# Patient Record
Sex: Male | Born: 1950 | Race: White | Hispanic: No | Marital: Single | Smoking: Current every day smoker
Health system: Southern US, Community
[De-identification: ages and names within clinical notes are randomized; demographics above are authoritative.]

## PROBLEM LIST (undated history)

## (undated) DIAGNOSIS — T7840XA Allergy, unspecified, initial encounter: Secondary | ICD-10-CM

## (undated) DIAGNOSIS — M109 Gout, unspecified: Secondary | ICD-10-CM

## (undated) HISTORY — PX: BUNIONECTOMY: SHX129

## (undated) HISTORY — DX: Allergy, unspecified, initial encounter: T78.40XA

## (undated) HISTORY — DX: Gout, unspecified: M10.9

## (undated) HISTORY — PX: CHOLECYSTECTOMY: SHX55

## (undated) HISTORY — PX: OTHER SURGICAL HISTORY: SHX169

---

## 2002-01-24 ENCOUNTER — Emergency Department (HOSPITAL_COMMUNITY): Admission: EM | Admit: 2002-01-24 | Discharge: 2002-01-24 | Payer: Self-pay | Admitting: Emergency Medicine

## 2002-08-02 ENCOUNTER — Encounter: Admission: RE | Admit: 2002-08-02 | Discharge: 2002-08-02 | Payer: Self-pay | Admitting: Internal Medicine

## 2002-08-02 ENCOUNTER — Encounter: Payer: Self-pay | Admitting: Internal Medicine

## 2004-07-08 ENCOUNTER — Ambulatory Visit: Payer: Self-pay | Admitting: Family Medicine

## 2004-07-14 ENCOUNTER — Encounter: Admission: RE | Admit: 2004-07-14 | Discharge: 2004-07-14 | Payer: Self-pay | Admitting: Family Medicine

## 2004-07-28 ENCOUNTER — Ambulatory Visit: Payer: Self-pay | Admitting: Internal Medicine

## 2004-08-10 ENCOUNTER — Encounter (INDEPENDENT_AMBULATORY_CARE_PROVIDER_SITE_OTHER): Payer: Self-pay | Admitting: Specialist

## 2004-08-10 ENCOUNTER — Ambulatory Visit (HOSPITAL_COMMUNITY): Admission: RE | Admit: 2004-08-10 | Discharge: 2004-08-10 | Payer: Self-pay | Admitting: Internal Medicine

## 2004-08-10 ENCOUNTER — Ambulatory Visit: Payer: Self-pay | Admitting: Internal Medicine

## 2004-08-24 ENCOUNTER — Ambulatory Visit: Payer: Self-pay | Admitting: Internal Medicine

## 2005-05-17 ENCOUNTER — Ambulatory Visit: Payer: Self-pay | Admitting: Internal Medicine

## 2005-05-27 ENCOUNTER — Ambulatory Visit: Payer: Self-pay | Admitting: Internal Medicine

## 2005-06-02 ENCOUNTER — Encounter: Admission: RE | Admit: 2005-06-02 | Discharge: 2005-06-02 | Payer: Self-pay | Admitting: Internal Medicine

## 2005-07-14 ENCOUNTER — Ambulatory Visit (HOSPITAL_COMMUNITY): Admission: RE | Admit: 2005-07-14 | Discharge: 2005-07-15 | Payer: Self-pay | Admitting: Orthopedic Surgery

## 2009-06-23 ENCOUNTER — Encounter (INDEPENDENT_AMBULATORY_CARE_PROVIDER_SITE_OTHER): Payer: Self-pay | Admitting: Surgery

## 2009-06-23 ENCOUNTER — Observation Stay (HOSPITAL_COMMUNITY): Admission: EM | Admit: 2009-06-23 | Discharge: 2009-06-24 | Payer: Self-pay | Admitting: Emergency Medicine

## 2010-12-16 LAB — CREATININE, SERUM
Creatinine, Ser: 1 mg/dL (ref 0.4–1.5)
GFR calc Af Amer: >60 mL/min (ref >60)
GFR calc non Af Amer: >60 mL/min (ref >60)

## 2010-12-16 LAB — DIFFERENTIAL
Basophils Absolute: 0.1 10*3/uL (ref 0.0–0.1)
Basophils Relative: 0 % (ref 0–1)
Eosinophils Absolute: 0 10*3/uL (ref 0.0–0.7)
Eosinophils Relative: 0 % (ref 0–5)
Lymphocytes Relative: 8 % — ABNORMAL LOW (ref 12–46)
Lymphs Abs: 1.3 10*3/uL (ref 0.7–4.0)
Monocytes Absolute: 1.6 10*3/uL — ABNORMAL HIGH (ref 0.1–1.0)
Monocytes Relative: 10 % (ref 3–12)
Neutro Abs: 13.4 10*3/uL — ABNORMAL HIGH (ref 1.7–7.7)
Neutrophils Relative %: 82 % — ABNORMAL HIGH (ref 43–77)

## 2010-12-16 LAB — CBC
HCT: 38.8 % — ABNORMAL LOW (ref 39.0–52.0)
HCT: 42.7 % (ref 39.0–52.0)
Hemoglobin: 13.2 g/dL (ref 13.0–17.0)
Hemoglobin: 14.7 g/dL (ref 13.0–17.0)
MCHC: 34 g/dL (ref 30.0–36.0)
MCHC: 34.4 g/dL (ref 30.0–36.0)
MCV: 96.7 fL (ref 78.0–100.0)
MCV: 97.1 fL (ref 78.0–100.0)
Platelets: 131 10*3/uL — ABNORMAL LOW (ref 150–400)
Platelets: 173 10*3/uL (ref 150–400)
RBC: 4 MIL/uL — ABNORMAL LOW (ref 4.22–5.81)
RBC: 4.41 MIL/uL (ref 4.22–5.81)
RDW: 13.1 % (ref 11.5–15.5)
RDW: 13.3 % (ref 11.5–15.5)
WBC: 12.3 10*3/uL — ABNORMAL HIGH (ref 4.0–10.5)
WBC: 16.4 10*3/uL — ABNORMAL HIGH (ref 4.0–10.5)

## 2010-12-16 LAB — COMPREHENSIVE METABOLIC PANEL
ALT: 19 U/L (ref 0–53)
AST: 19 U/L (ref 0–37)
Albumin: 4.3 g/dL (ref 3.5–5.2)
Alkaline Phosphatase: 48 U/L (ref 39–117)
BUN: 9 mg/dL (ref 6–23)
CO2: 29 mEq/L (ref 19–32)
Calcium: 9 mg/dL (ref 8.4–10.5)
Chloride: 102 mEq/L (ref 96–112)
Creatinine, Ser: 0.73 mg/dL (ref 0.4–1.5)
GFR calc Af Amer: >60 mL/min (ref >60)
GFR calc non Af Amer: >60 mL/min (ref >60)
Glucose, Bld: 126 mg/dL — ABNORMAL HIGH (ref 70–99)
Potassium: 4.2 mEq/L (ref 3.5–5.1)
Sodium: 139 mEq/L (ref 135–145)
Total Bilirubin: 0.9 mg/dL (ref 0.3–1.2)
Total Protein: 6.8 g/dL (ref 6.0–8.3)

## 2010-12-16 LAB — LIPASE, BLOOD
Lipase: 18 U/L (ref 11–59)
Lipase: 24 U/L (ref 11–59)

## 2010-12-16 LAB — BILIRUBIN, TOTAL: Total Bilirubin: 1 mg/dL (ref 0.3–1.2)

## 2010-12-16 LAB — POTASSIUM: Potassium: 4.1 mEq/L (ref 3.5–5.1)

## 2011-01-28 NOTE — Op Note (Signed)
NAME:  Randy Fernandez, Randy Fernandez              ACCOUNT NO.:  [id number]   MEDICAL RECORD NO.:  [id number]          PATIENT TYPE:  AMB   LOCATION:  DAY                          FACILITY:  [facility]   PHYSICIAN:  Georges Lynch. Gioffre, M.D.DATE OF BIRTH:  06/09/1951   DATE OF PROCEDURE:  07/14/2005  DATE OF DISCHARGE:                                 OPERATIVE REPORT   SURGEON:  Dr. Darrelyn Hillock   ASSISTANT:  Dr. Fayrene Fearing [place].   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  He  had right leg pain only preop.   OPERATION:  1.  Hemilaminectomy at L5-S1 on the right.  2.  Foraminotomy at L5-S1 on the right.  3.  Microdiskectomy at L5-S1 on the right.   PROCEDURE:  Under general anesthesia, a routine orthopedic prep and draping  lower back was carried out.  Two needles were placed in the back for  localization purposes, and x-ray was taken.  At this time, we identified the  L5-S1 interspace; an incision was made over that area, bleeders identified  and cauterized.  Self-retaining retractors were inserted.  The incision was  carried down to the sacrum and to the L5-S1 space.  Another x-ray was taken  to verify our position.  At this time, we then carried out a hemilaminectomy  in the usual fashion.  We protected the dura with the cottonoids and then  excised the ligamentum flavum.  We exposed the dura and the S1 root.  The S1  root was quite irritated and swollen.  We did a nice foraminotomy first of  all to provide more room for the root and at this time, we gently retracted  the nerve root; we identified the lateral recess veins and cauterized those  with a bipolar.  We then made a cruciate incision into the disk space and  did a complete diskectomy.  As soon as we made the incision, there was disk  material extruded out through the subligamentous space.  We then thoroughly  irrigated out the area.  Multiple  passes were made in the space and into the  subligamentous area, and there was no other disk material noted.  The nerve  root now was quite free.  We were able to easily pass a hockey-stick around  the root.  We then irrigated the area.  I then loosely applied some thrombin-  soaked Gelfoam.  The wound was closed in layers in the usual fashion, except  I left a small part of the deep portion of the wound open for drainage  purposes.  The remaining part of the wound was closed in the usual fashion.  As I mentioned, skin was closed with metal staples.  Prior to closing the  skin, I injected the muscle and subcu with about 20 mL of 0.5% Marcaine with  epinephrine.  Sterile Neosporin dressings were applied.  The patient had 1  gram of IV Ancef preop.  He had 30 mg of Toradol at the end of the  procedure.  ______________________________  Georges Lynch Darrelyn Hillock, M.D.     RAG/MEDQ  D:  07/14/2005  T:  07/14/2005  Job:  [id number]

## 2011-02-25 IMAGING — RF DG CHOLANGIOGRAM OPERATIVE
1 series · 4 of 4 positions shown · non-contrast
Comparison: None

CLINICAL DATA: Right upper quadrant pain

INTRAOPERATIVE CHOLANGIOGRAM
TECHNIQUE: Cholangiographic images from the C-arm fluoroscopic
device were submitted for interpretation post-operatively.  Please
see the procedural report for the amount of contrast and the
fluoroscopy time utilized.

[Series 1: run · 4 of 97 frames shown]
[frame 15/97]
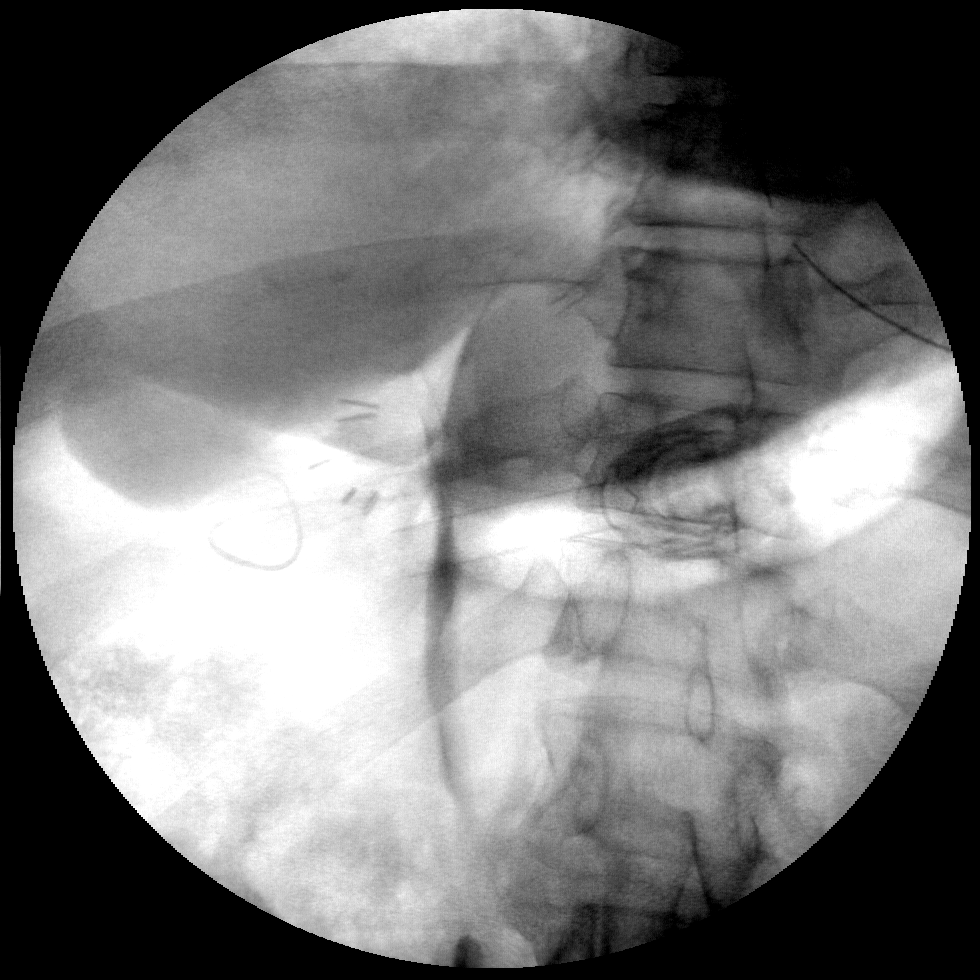
[frame 40/97]
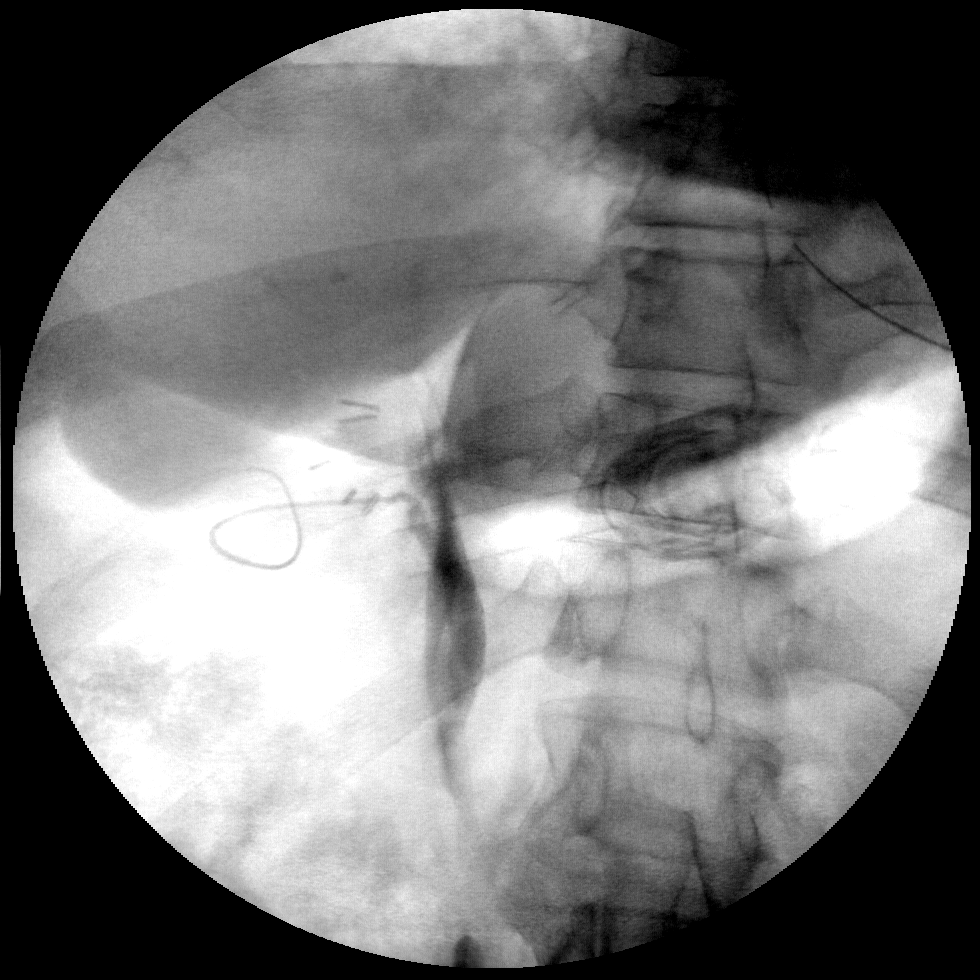
[frame 49/97]
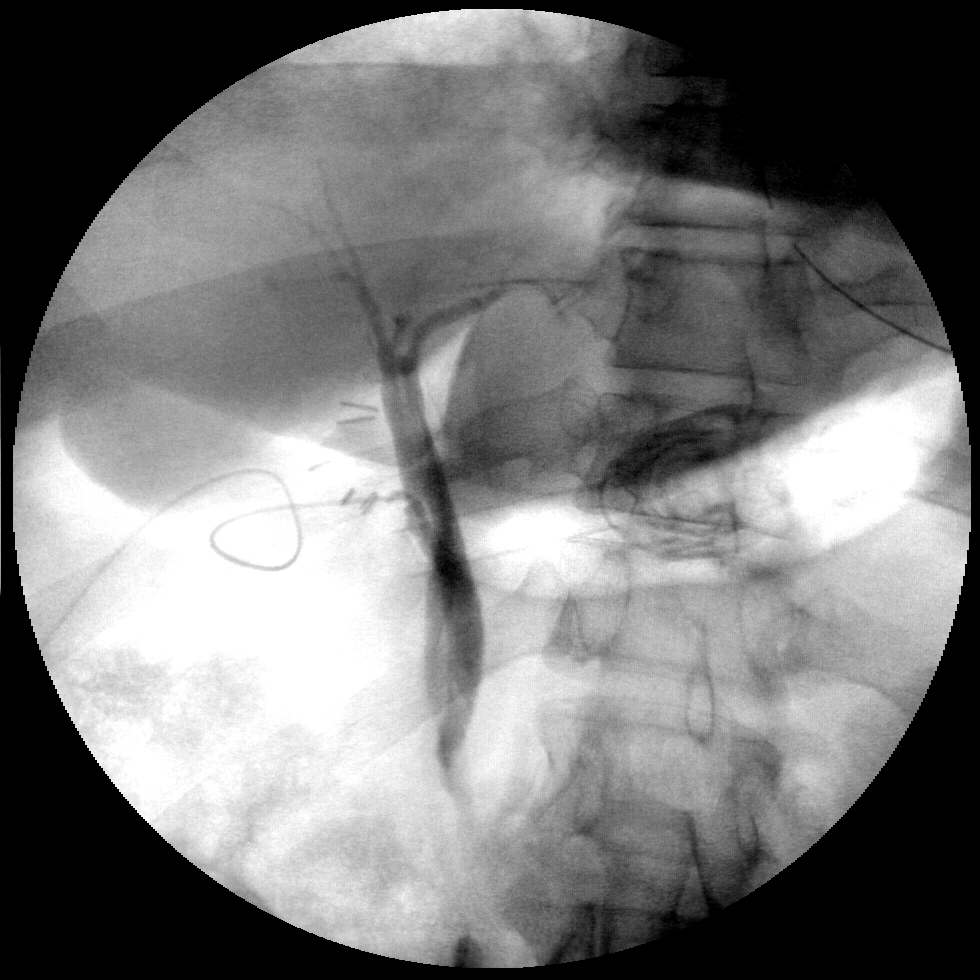
[frame 83/97]
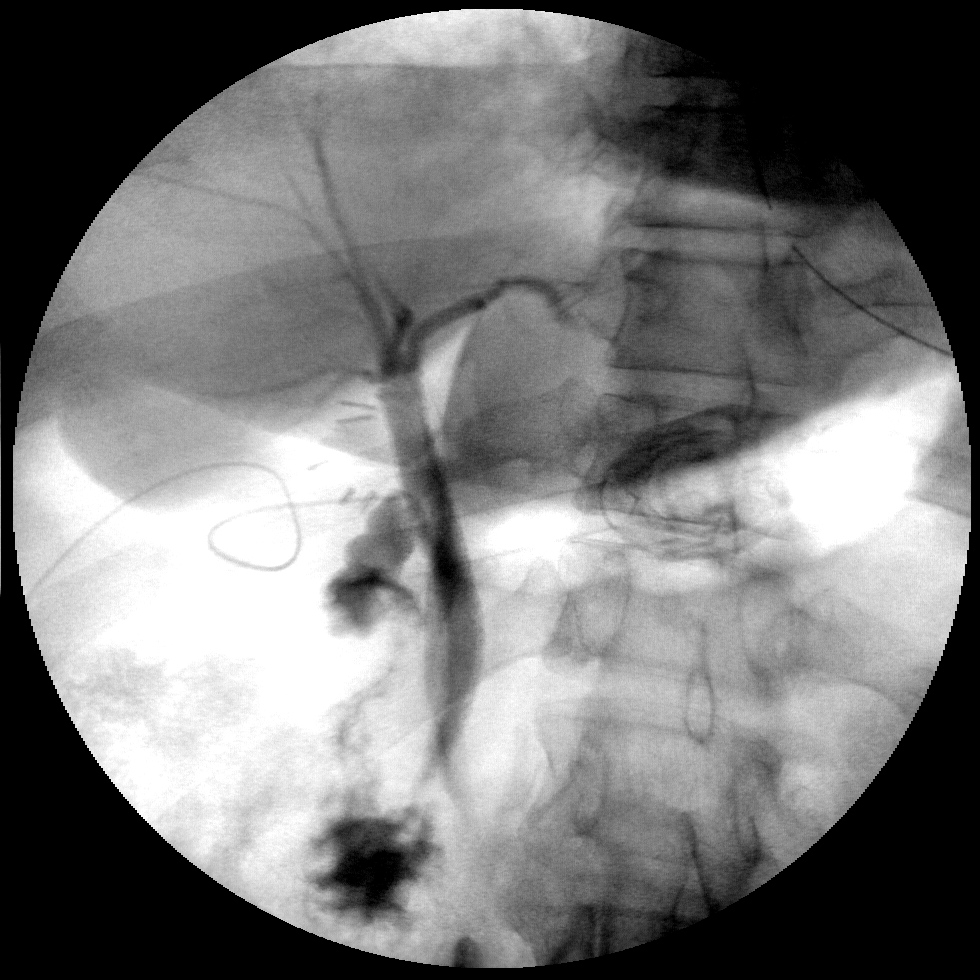

[4 of 4 positions shown; findings below may reference images not displayed]

FINDINGS: Contrast was injected via the cystic duct.  There is
good visualization of the common bile duct and some intrahepatic
ducts.  There is free passage of contrast into the duodenal loop.
No persistent filling defect or obstruction is seen.
IMPRESSION: Negative intraoperative cholangiogram.

## 2016-12-27 ENCOUNTER — Encounter: Payer: Self-pay | Admitting: Internal Medicine

## 2017-02-22 ENCOUNTER — Ambulatory Visit (AMBULATORY_SURGERY_CENTER): Payer: Self-pay

## 2017-02-22 VITALS — Ht 70.0 in | Wt 187.8 lb

## 2017-02-22 DIAGNOSIS — Z86010 Personal history of colonic polyps: Secondary | ICD-10-CM

## 2017-02-22 NOTE — Progress Notes (Signed)
Denies allergies to eggs or soy products. Denies complication of anesthesia or sedation. Denies use of weight loss medication. Denies use of O2.   Emmi instructions declined.  

## 2017-03-09 ENCOUNTER — Encounter: Payer: Self-pay | Admitting: Internal Medicine

## 2017-03-09 ENCOUNTER — Ambulatory Visit (AMBULATORY_SURGERY_CENTER): Payer: Medicare Other | Admitting: Internal Medicine

## 2017-03-09 VITALS — BP 127/79 | HR 57 | Temp 98.0°F | Resp 13 | Ht 70.0 in | Wt 187.0 lb

## 2017-03-09 DIAGNOSIS — Z1211 Encounter for screening for malignant neoplasm of colon: Secondary | ICD-10-CM | POA: Diagnosis present

## 2017-03-09 DIAGNOSIS — K621 Rectal polyp: Secondary | ICD-10-CM | POA: Diagnosis not present

## 2017-03-09 DIAGNOSIS — Z1212 Encounter for screening for malignant neoplasm of rectum: Secondary | ICD-10-CM | POA: Diagnosis not present

## 2017-03-09 DIAGNOSIS — D128 Benign neoplasm of rectum: Secondary | ICD-10-CM

## 2017-03-09 MED ORDER — SODIUM CHLORIDE 0.9 % IV SOLN
500.0000 mL | INTRAVENOUS | Status: AC
Start: 2017-03-09 — End: ?

## 2017-03-09 NOTE — Progress Notes (Signed)
Called to room to assist during endoscopic procedure.  Patient ID and intended procedure confirmed with present staff. Received instructions for my participation in the procedure from the performing physician.  

## 2017-03-09 NOTE — Op Note (Signed)
[facility] Patient Name: Randy Fernandez Procedure Date: 03/09/2017 9:14 AM MRN: [id number] Endoscopist: Gatha Mayer , MD Age: 66 Referring MD:  Date of Birth: [birth year] Gender: Male Account #: [id number] Procedure:                Colonoscopy Indications:              Screening for colorectal malignant neoplasm Medicines:                Monitored Anesthesia Care Procedure:                Pre-Anesthesia Assessment:                           - Prior to the procedure, a History and Physical                            was performed, and patient medications and                            allergies were reviewed. The patient's tolerance of                            previous anesthesia was also reviewed. The risks                            and benefits of the procedure and the sedation                            options and risks were discussed with the patient.                            All questions were answered, and informed consent                            was obtained. Prior Anticoagulants: The patient has                            taken no previous anticoagulant or antiplatelet                            agents. ASA Grade Assessment: II - A patient with                            mild systemic disease. After reviewing the risks                            and benefits, the patient was deemed in                            satisfactory condition to undergo the procedure.                           After obtaining informed consent, the colonoscope  was passed under direct vision. Throughout the                            procedure, the patient's blood pressure, pulse, and                            oxygen saturations were monitored continuously. The                            Colonoscope was introduced through the anus and                            advanced to the the cecum, identified by                            appendiceal orifice and  ileocecal valve. The                            colonoscopy was performed without difficulty. The                            patient tolerated the procedure well. The quality                            of the bowel preparation was excellent. The bowel                            preparation used was Miralax. The ileocecal valve,                            appendiceal orifice, and rectum were photographed. Scope In: 9:32:45 AM Scope Out: 9:48:34 AM Total Procedure Duration: 0 hours 15 minutes 49 seconds  Findings:                 The perianal and digital rectal examinations were                            normal. Pertinent negatives include normal prostate                            (size, shape, and consistency).                           A 3 mm polyp was found in the rectum. The polyp was                            sessile. The polyp was removed with a cold biopsy                            forceps. Resection and retrieval were complete.                            Verification of patient identification for the  specimen was done. Estimated blood loss was minimal.                           Many small and large-mouthed diverticula were found                            in the sigmoid colon and descending colon.                           The exam was otherwise without abnormality on                            direct and retroflexion views. Complications:            No immediate complications. Estimated Blood Loss:     Estimated blood loss was minimal. Impression:               - One 3 mm polyp in the rectum, removed with a cold                            biopsy forceps. Resected and retrieved.                           - Diverticulosis in the sigmoid colon and in the                            descending colon.                           - The examination was otherwise normal on direct                            and retroflexion views. Recommendation:           -  Patient has a contact number available for                            emergencies. The signs and symptoms of potential                            delayed complications were discussed with the                            patient. Return to normal activities tomorrow.                            Written discharge instructions were provided to the                            patient.                           - Resume previous diet.                           - Continue present medications.                           -  Repeat colonoscopy is recommended. The                            colonoscopy date will be determined after pathology                            results from today's exam become available for                            review. Gatha Mayer, MD 03/09/2017 9:53:33 AM This report has been signed electronically.

## 2017-03-09 NOTE — Patient Instructions (Addendum)
One tiny polyp removed. You also have a condition called diverticulosis - common and not usually a problem. Please read the handout provided.  I will let you know pathology results and when to have another routine colonoscopy by mail and/or My Chart.  I appreciate the opportunity to care for you. Gatha Mayer, MD, [facility]   Discharge instructions given. Handouts on polyps and diverticulosis. Resume previous medications. YOU HAD AN ENDOSCOPIC PROCEDURE TODAY AT [facility]:   Refer to the procedure report that was given to you for any specific questions about what was found during the examination.  If the procedure report does not answer your questions, please call your gastroenterologist to clarify.  If you requested that your care partner not be given the details of your procedure findings, then the procedure report has been included in a sealed envelope for you to review at your convenience later.  YOU SHOULD EXPECT: Some feelings of bloating in the abdomen. Passage of more gas than usual.  Walking can help get rid of the air that was put into your GI tract during the procedure and reduce the bloating. If you had a lower endoscopy (such as a colonoscopy or flexible sigmoidoscopy) you may notice spotting of blood in your stool or on the toilet paper. If you underwent a bowel prep for your procedure, you may not have a normal bowel movement for a few days.  Please Note:  You might notice some irritation and congestion in your nose or some drainage.  This is from the oxygen used during your procedure.  There is no need for concern and it should clear up in a day or so.  SYMPTOMS TO REPORT IMMEDIATELY:   Following lower endoscopy (colonoscopy or flexible sigmoidoscopy):  Excessive amounts of blood in the stool  Significant tenderness or worsening of abdominal pains  Swelling of the abdomen that is new, acute  Fever of 100F or higher   For urgent or emergent  issues, a gastroenterologist can be reached at any hour by calling [phone].   DIET:  We do recommend a small meal at first, but then you may proceed to your regular diet.  Drink plenty of fluids but you should avoid alcoholic beverages for 24 hours.  ACTIVITY:  You should plan to take it easy for the rest of today and you should NOT DRIVE or use heavy machinery until tomorrow (because of the sedation medicines used during the test).    FOLLOW UP: Our staff will call the number listed on your records the next business day following your procedure to check on you and address any questions or concerns that you may have regarding the information given to you following your procedure. If we do not reach you, we will leave a message.  However, if you are feeling well and you are not experiencing any problems, there is no need to return our call.  We will assume that you have returned to your regular daily activities without incident.  If any biopsies were taken you will be contacted by phone or by letter within the next 1-3 weeks.  Please call us at [phone] if you have not heard about the biopsies in 3 weeks.    SIGNATURES/CONFIDENTIALITY: You and/or your care partner have signed paperwork which will be entered into your electronic medical record.  These signatures attest to the fact that that the information above on your After Visit Summary has been reviewed and is understood.  Full responsibility of the confidentiality of this discharge information lies with you and/or your care-partner.

## 2017-03-09 NOTE — Progress Notes (Signed)
To recovery, report to McCoy, RN, VSS 

## 2017-03-10 ENCOUNTER — Telehealth: Payer: Self-pay

## 2017-03-10 ENCOUNTER — Telehealth: Payer: Self-pay | Admitting: *Deleted

## 2017-03-10 NOTE — Telephone Encounter (Signed)
  Follow up Call-  Call back number 03/09/2017  Post procedure Call Back phone  # [phone]  Permission to leave phone message Yes  Some recent data might be hidden     Patient questions:  Do you have a fever, pain , or abdominal swelling? No. Pain Score  0 *  Have you tolerated food without any problems? Yes.    Have you been able to return to your normal activities? Yes.    Do you have any questions about your discharge instructions: Diet   No. Medications  No. Follow up visit  No.  Do you have questions or concerns about your Care? No.  Actions: * If pain score is 4 or above: No action needed, pain <4.

## 2017-03-10 NOTE — Telephone Encounter (Signed)
Left message on f/u call 

## 2017-03-16 ENCOUNTER — Encounter: Payer: Self-pay | Admitting: Internal Medicine

## 2019-10-11 ENCOUNTER — Ambulatory Visit: Payer: Medicare Other

## 2019-10-28 ENCOUNTER — Ambulatory Visit: Payer: Medicare Other
